# Patient Record
Sex: Male | Born: 1985 | Race: Black or African American | Hispanic: No | Marital: Single | Smoking: Never smoker
Health system: Southern US, Community
[De-identification: ages and names within clinical notes are randomized; demographics above are authoritative.]

## PROBLEM LIST (undated history)

## (undated) DIAGNOSIS — K219 Gastro-esophageal reflux disease without esophagitis: Secondary | ICD-10-CM

## (undated) DIAGNOSIS — I10 Essential (primary) hypertension: Secondary | ICD-10-CM

---

## 2020-02-06 NOTE — Congregational Nurse Program (Signed)
  Dept: [phone]   Congregational Nurse Program Note  Date of Encounter: 02/06/2020  Past Medical History: No past medical history on file.  Encounter Details: Client came in to establish care with Congregational Nurse. He is a new refugee from [place] and requesting assistance to schedule appointment with TB Nurse at [facility] department. Same has been scheduled for 02/12/20 and advised to call me for assistance with transportation.  Blood pressure is 138/89. I have educated him on heart healthy diet and to reduce salt intake. Information regarding physical exercise provided.  Arman Bogus RN BSN PCCN  [facility] [phone]

## 2020-02-07 ENCOUNTER — Other Ambulatory Visit: Payer: Self-pay

## 2020-02-07 ENCOUNTER — Emergency Department (HOSPITAL_COMMUNITY): Payer: Medicaid Other

## 2020-02-07 ENCOUNTER — Ambulatory Visit (HOSPITAL_COMMUNITY)
Admission: EM | Admit: 2020-02-07 | Discharge: 2020-02-07 | Disposition: A | Discharge status: Home / Self Care | Payer: Self-pay | Attending: Internal Medicine | Admitting: Internal Medicine

## 2020-02-07 ENCOUNTER — Encounter (HOSPITAL_COMMUNITY): Payer: Self-pay | Admitting: Emergency Medicine

## 2020-02-07 ENCOUNTER — Emergency Department (HOSPITAL_COMMUNITY)
Admission: EM | Admit: 2020-02-07 | Discharge: 2020-02-08 | Disposition: A | Discharge status: Home / Self Care | Payer: Medicaid Other | Attending: Emergency Medicine | Admitting: Emergency Medicine

## 2020-02-07 DIAGNOSIS — R079 Chest pain, unspecified: Secondary | ICD-10-CM | POA: Diagnosis not present

## 2020-02-07 DIAGNOSIS — I10 Essential (primary) hypertension: Secondary | ICD-10-CM | POA: Insufficient documentation

## 2020-02-07 DIAGNOSIS — K219 Gastro-esophageal reflux disease without esophagitis: Secondary | ICD-10-CM

## 2020-02-07 HISTORY — DX: Essential (primary) hypertension: I10

## 2020-02-07 HISTORY — DX: Gastro-esophageal reflux disease without esophagitis: K21.9

## 2020-02-07 LAB — BASIC METABOLIC PANEL
Anion gap: 10 (ref 5–15)
BUN: 12 mg/dL (ref 6–20)
CO2: 24 mmol/L (ref 22–32)
Calcium: 9.7 mg/dL (ref 8.9–10.3)
Chloride: 104 mmol/L (ref 98–111)
Creatinine, Ser: 0.89 mg/dL (ref 0.61–1.24)
GFR, Estimated: >60 mL/min (ref >60)
Glucose, Bld: 99 mg/dL (ref 70–99)
Potassium: 3.6 mmol/L (ref 3.5–5.1)
Sodium: 138 mmol/L (ref 135–145)

## 2020-02-07 LAB — TROPONIN I (HIGH SENSITIVITY)
Troponin I (High Sensitivity): <2 ng/L (ref <18)
Troponin I (High Sensitivity): 2 ng/L (ref <18)

## 2020-02-07 LAB — CBC
HCT: 46.4 % (ref 39.0–52.0)
Hemoglobin: 15.8 g/dL (ref 13.0–17.0)
MCH: 30.5 pg (ref 26.0–34.0)
MCHC: 34.1 g/dL (ref 30.0–36.0)
MCV: 89.6 fL (ref 80.0–100.0)
Platelets: 143 10*3/uL — ABNORMAL LOW (ref 150–400)
RBC: 5.18 MIL/uL (ref 4.22–5.81)
RDW: 12.9 % (ref 11.5–15.5)
WBC: 5.3 10*3/uL (ref 4.0–10.5)
nRBC: 0 % (ref 0.0–0.2)

## 2020-02-07 MED ORDER — FAMOTIDINE 20 MG PO TABS
20.0000 mg | ORAL_TABLET | Freq: Two times a day (BID) | ORAL | 0 refills | Status: AC
Start: 2020-02-07 — End: ?

## 2020-02-07 NOTE — ED Notes (Signed)
Patient is being discharged from the Urgent Care Center and sent to the Emergency Department via wheelchair by staff. Per Roosvelt Maser, PA, patient is stable but in need of higher level of care due to chest pain, abnormal EKG. Patient is aware and verbalizes understanding of plan of care.  Vitals:   02/07/20 1221  BP: (!) 157/95  Pulse: 63  Resp: 18  Temp: 97.9 F (36.6 C)  SpO2: 100%

## 2020-02-07 NOTE — ED Notes (Signed)
Looked for patient multiple times to complete EKG. Pt not visualized in bed or seen in hallway.

## 2020-02-07 NOTE — ED Provider Notes (Signed)
[facility] Provider Note   CSN: [id number] Arrival date & time: 02/07/20  1315     History Chief Complaint  Patient presents with  . Chest Pain    [facility] is a 34 y.o. male with a history of GERD, H. pylori, and HTN who presents to the emergency department with a chief complaint of chest pain.  The patient states that he had an episode of mid to left-sided chest pain last night that began after eating spicy lentil soup.  He characterizes the episode as feeling a warm, burning station in his chest.  The pain radiated up into his throat, to his shoulders.  It did not radiate to his back.  Pain was better when he was up and exerting himself and drinking a glass of water.  Pain was worse when he tried to lay flat and go to sleep.  He has a history of similar pain and was diagnosed with H. pylori in [place] in 2016.  He was successfully treated and was given pantoprazole to take for GERD.  He attempted to take pantoprazole last night with no improvement in his symptoms.  Chest pain had resolved when he awoke this morning and he has not had any recurrence of symptoms today.  He reports associated increased burping and belching and a nonproductive cough that resolved along with the episode of chest pain.  He denies shortness of breath, diaphoresis, fever, palpitations, abdominal pain, nausea, vomiting, diarrhea, back pain, or URI symptoms.  He has a history of latent TB.  He is following up on November 10 with the TB program.  He denies any hemoptysis, night sweats, or weight loss.  He is a never smoker.  He denies illicit or recreational substance use.  No alcohol use.  Patient speaks both [place] and [place].  [place] interpreter was offered, but patient requested to speak English during his ER interactions.  He relocated to the [place] from [place] on September 24.  The history is provided by the patient and medical records. No language interpreter  was used.       Past Medical History:  Diagnosis Date  . GERD (gastroesophageal reflux disease)   . Hypertension     There are no problems to display for this patient.   History reviewed. No pertinent surgical history.     Family History  Family history unknown: Yes    Social History   Tobacco Use  . Smoking status: Never Smoker  . Smokeless tobacco: Never Used  Vaping Use  . Vaping Use: Never used  Substance Use Topics  . Alcohol use: Not Currently  . Drug use: Not on file    Home Medications Prior to Admission medications   Not on File    Allergies    Eggs or egg-derived products  Review of Systems   Review of Systems  Constitutional: Negative for appetite change, chills, diaphoresis and fever.  HENT: Negative for congestion and sore throat.   Eyes: Negative for visual disturbance.  Respiratory: Positive for cough. Negative for shortness of breath.   Cardiovascular: Positive for chest pain. Negative for palpitations and leg swelling.  Gastrointestinal: Negative for abdominal pain, blood in stool, diarrhea, nausea and vomiting.       Belching  Genitourinary: Negative for dysuria.  Musculoskeletal: Negative for back pain, myalgias, neck pain and neck stiffness.  Skin: Negative for rash.  Allergic/Immunologic: Negative for immunocompromised state.  Neurological: Negative for dizziness, seizures, syncope, speech difficulty, weakness, light-headedness,  numbness and headaches.  Psychiatric/Behavioral: Negative for confusion.    Physical Exam Updated Vital Signs BP (!) 129/93 (BP Location: Left Arm)   Pulse 64   Temp 98.5 F (36.9 C) (Oral)   Resp 16   SpO2 96%   Physical Exam Vitals and nursing note reviewed.  Constitutional:      General: He is not in acute distress.    Appearance: He is well-developed. He is not ill-appearing, toxic-appearing or diaphoretic.     Comments: Well-appearing.  No acute distress.  HENT:     Head: Normocephalic.      Mouth/Throat:     Mouth: Mucous membranes are moist.     Pharynx: No oropharyngeal exudate or posterior oropharyngeal erythema.  Eyes:     Conjunctiva/sclera: Conjunctivae normal.  Cardiovascular:     Rate and Rhythm: Normal rate and regular rhythm.     Pulses: Normal pulses.     Heart sounds: Normal heart sounds. No murmur heard.  No friction rub. No gallop.   Pulmonary:     Effort: Pulmonary effort is normal. No respiratory distress.     Breath sounds: No stridor. No wheezing, rhonchi or rales.     Comments: Lungs are clear to auscultation bilaterally without increased work of breathing.  No reproducible tenderness palpation to the chest wall. Chest:     Chest wall: No tenderness.  Abdominal:     General: There is no distension.     Palpations: Abdomen is soft. There is no mass.     Tenderness: There is no abdominal tenderness. There is no right CVA tenderness, left CVA tenderness, guarding or rebound.     Hernia: No hernia is present.     Comments: Abdomen soft, nontender, nondistended.  Normoactive bowel sounds.  No peritoneal signs.  Musculoskeletal:        General: No tenderness.     Cervical back: Neck supple.     Right lower leg: No edema.     Left lower leg: No edema.  Skin:    General: Skin is warm and dry.     Capillary Refill: Capillary refill takes less than 2 seconds.     Coloration: Skin is not jaundiced or pale.     Findings: No erythema, lesion or rash.  Neurological:     Mental Status: He is alert.  Psychiatric:        Behavior: Behavior normal.     ED Results / Procedures / Treatments   Labs (all labs ordered are listed, but only abnormal results are displayed) Labs Reviewed  CBC - Abnormal; Notable for the following components:      Result Value   Platelets 143 (*)    All other components within normal limits  BASIC METABOLIC PANEL  TROPONIN I (HIGH SENSITIVITY)  TROPONIN I (HIGH SENSITIVITY)    EKG None  Radiology DG Chest 2 View  Result  Date: 02/07/2020 CLINICAL DATA:  Left-sided chest pain. EXAM: CHEST - 2 VIEW COMPARISON:  No prior. FINDINGS: Mediastinum hilar structures normal. Lungs are clear. No pleural effusion or pneumothorax. Heart size normal. No acute bony abnormality identified. IMPRESSION: No acute cardiopulmonary disease. Electronically Signed   By: Maisie Fus  Register   On: 02/07/2020 14:25    Procedures Procedures (including critical care time)  Medications Ordered in ED Medications - No data to display  ED Course  I have reviewed the triage vital signs and the nursing notes.  Pertinent labs & imaging results that were available during my care of  the patient were reviewed by me and considered in my medical decision making (see chart for details).    MDM Rules/Calculators/A&P                          34 year old male with a history of GERD, H. pylori, and HTN presenting with chest pain accompanied by belching, and nonproductive cough began last night after eating a spicy lentil soup and resolved when he awoke this morning.  He has had no other associated symptoms.   This patient presents to the ED for concern of  chest pain this involves an extensive number of treatment options, and is a complaint that carries with it a high risk of complications and morbidity.  The differential diagnosis includes GERD, H. pylori, gastritis, tuberculosis, myocarditis, pericarditis, COVID-19, ACS, tension pneumothorax, community-acquired pneumonia, aortic dissection, PUD, or PE.      Lab Tests:    I Ordered, reviewed, and interpreted labs, which included  CBC, metabolic panel, troponin that showed were all reassuring.    Imaging Studies ordered:    I ordered imaging studies which included chest x-ray. I independently visualized and interpreted imaging which did not show any acute findings nor did it demonstrate any nodules or cavitary lesions or hilar lymphadenopathy.   Additional history obtained:    Previous records  obtained and reviewed   Patient has been pain-free for more than 24 hours.  I suspect that he is having a recurrence of GERD.  I have a low suspicion for peptic ulcer disease, PE, aortic dissection, ACS.  His chest x-ray was reassuring without cavitary lesions or nodules, less likely active TB or latent TB. HEAR score is 1, low suspicion for ACS.  Although he did relocate to the [place] from [place] over a month and a half ago, he is PERC negative, and I am less suspicious for PE.  Symptoms are most consistent with GERD.  Will discharge the patient home with a course of Pepcid.  He was instructed how to take his home PPI.  He has a follow-up appointment in 5 days and can be really evaluated in the clinic at that time.  Patient's EKG from the ER has not been performed.  I did review his EKG from urgent care that demonstrated normal sinus rhythm.  We will plan for discharge to home after EKG has been ordered and reviewed in the ER.  00:03-notified by ER tech that patient eloped prior to EKG being performed and did not receive his discharge papers or instructions.  However, I did verbalize plan of care to the patient earlier when I was at the bedside, but was unable to give him ER return precautions prior to elopement.  Final Clinical Impression(s) / ED Diagnoses Final diagnoses:  None    Rx / DC Orders ED Discharge Orders    None       Alieyah Spader A, PA-C 02/08/20 0009    Pricilla Loveless, MD 02/08/20 1521

## 2020-02-07 NOTE — ED Notes (Signed)
Pt not in department at this time. Provider aware.

## 2020-02-07 NOTE — ED Triage Notes (Signed)
USing [place] interpreter: Pt c/o chest pain last night on the left side. He states he was feeling chills and then a warm sensation. He states he has a hx of high blood pressure. He has a letter showing he was positive for latent TB. He states he took some medication for acid reflux.

## 2020-02-07 NOTE — ED Notes (Addendum)
Patient is being discharged from the Urgent Care and sent to the Emergency Department via  Wheelchair.  Per Dahlia Byes NP, patient is in need of higher level of care due to chest pain. Patient is aware and verbalizes understanding of plan of care.  Vitals:   02/07/20 1221  BP: (!) 157/95  Pulse: 63  Resp: 18  Temp: 97.9 F (36.6 C)  SpO2: 100%

## 2020-02-07 NOTE — ED Triage Notes (Signed)
Pt arrives from UC for c/o L sided chest pain that began last night, states that he gets cold and then feels a warm sensation in his chest. Pt in TB control program, hx of latent TB, has appt on 11/10 for f/u with TB program.

## 2020-02-07 NOTE — Discharge Instructions (Addendum)
Waad ku mahadsan tahay inaad ii ogolaatay inaan maanta kugu daryeelo [place].  Shaqadaada maanta waxay ahayd mid niyad-jab leh. Hubi inaad ilaaliso ballanta dabagalka 5 maalmood gudahood.  Qaado 1 kiniin oo famotidine ah 2 jeer maalin kasta sida loogu baahan yahay reflux aashitada, [place] gubanaya badhtamaha laabtaada oo ay ku sii badanayso daac ama finan, gaar ahaan ka dib markaad cuntid cuntooyinka basbaaska leh.  Waxa kale oo aad qaadan kartaa pantoprazole ama omeprazole, oo aad hadda guriga ku haysato, laakiin dawadani waxay qaadan kartaa dhawr maalmood si ay u shaqeyso.  Soo celi qaybta gargaarka degdegga ah haddii aad qabto xanuun laabta ah oo leh neefta oo ku dhegta, dhididka, haddii aad [address], matag [address] [place], [place] wax kale oo cusub, oo ku saabsan calaamadaha.  Thank you for allowing me to care for you today in the Emergency Department.   Your work-up today was reassuring.  Make sure that you keep your follow-up appointment in 5 days.  Take 1 tablet of famotidine 2 times daily as needed for acid reflux, burning in the center of your chest with increased burping or belching, especially after eating spicy foods.  You can also take pantoprazole or omeprazole, which you currently have at home, but this medication can take several days to work.  Return the emergency department if you have chest pain with shortness of breath, sweating, if you pass out, have uncontrollable vomiting, or other new, concerning symptoms.

## 2021-10-29 IMAGING — CR DG CHEST 2V
2 series · 2 of 2 positions shown · non-contrast
Comparison: No prior.

CLINICAL DATA: Left-sided chest pain.

EXAM:
CHEST - 2 VIEW

[chest pa]
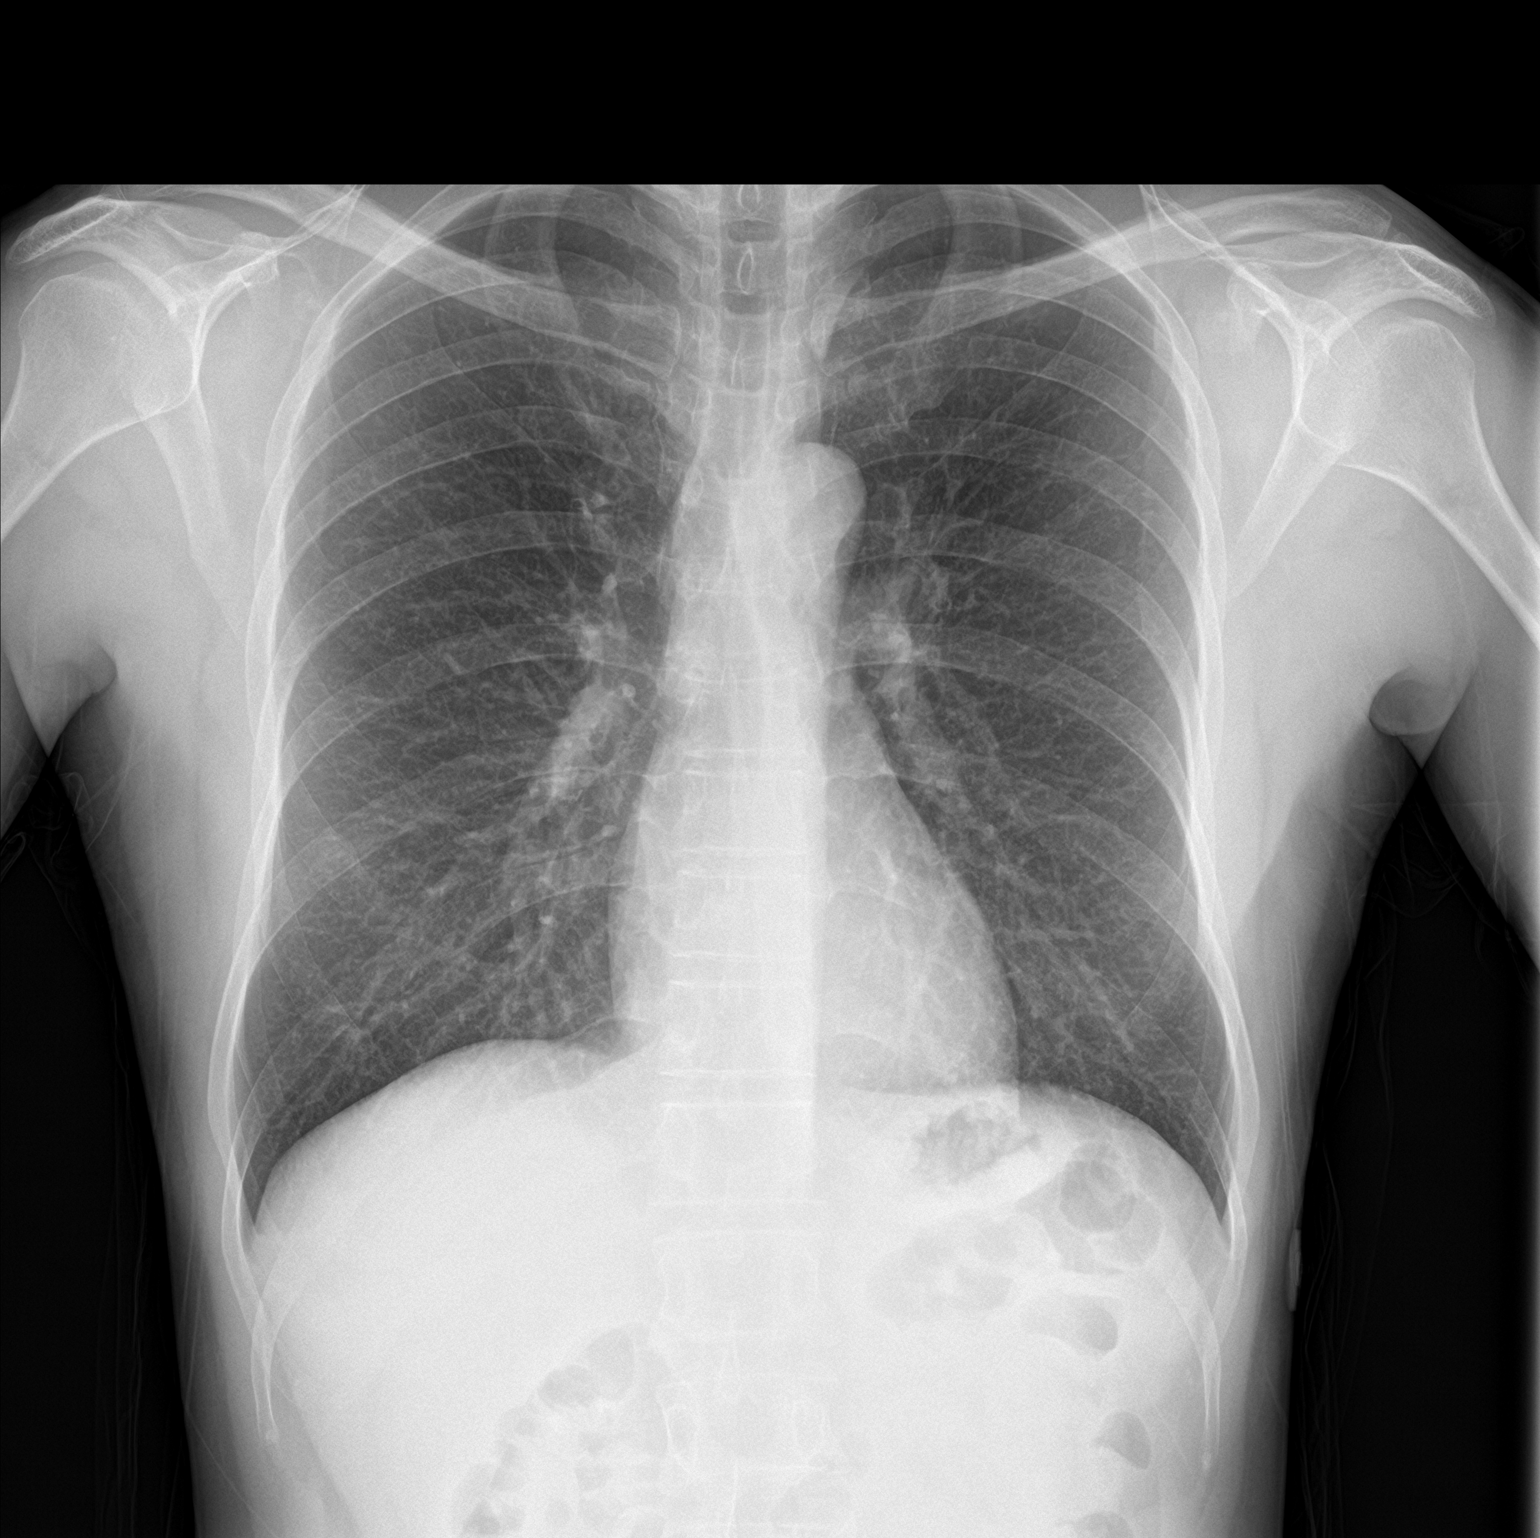

[chest lat]
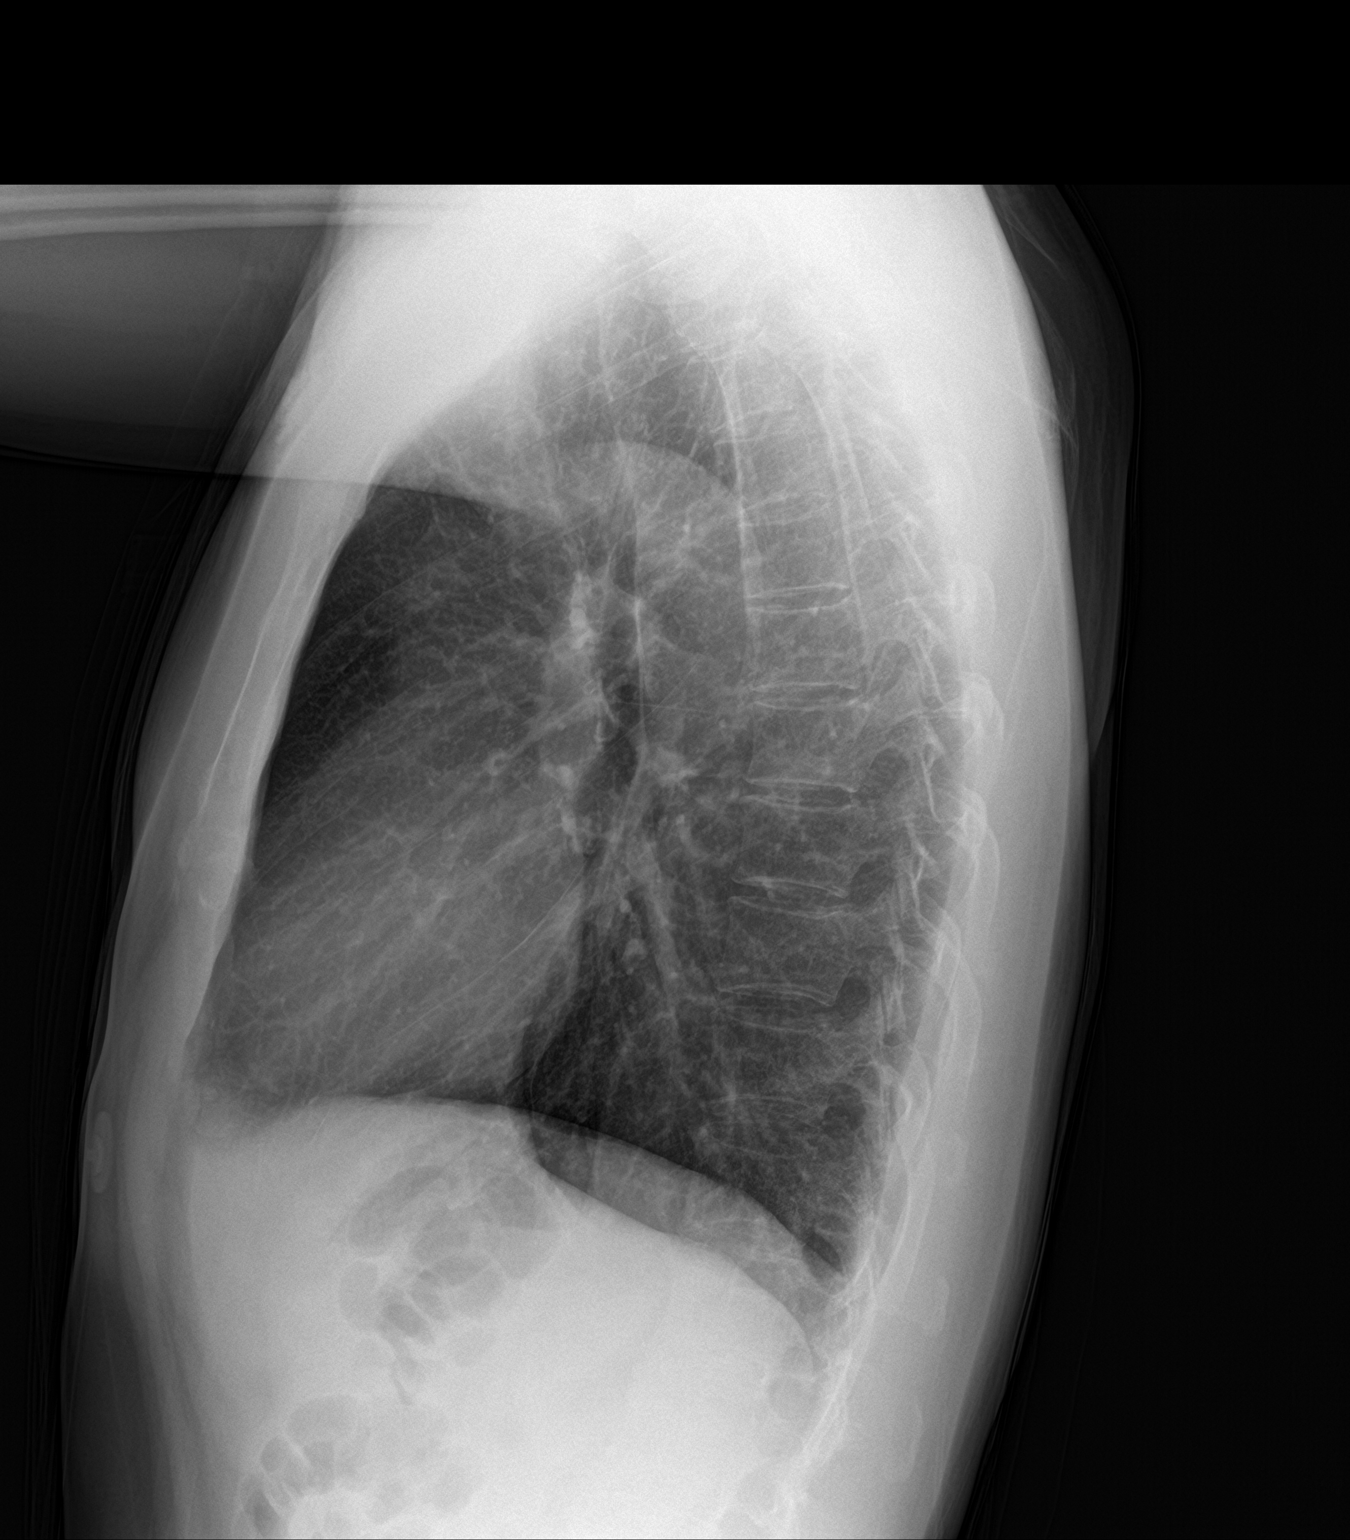

[2 of 2 positions shown; findings below may reference images not displayed]

FINDINGS: Mediastinum hilar structures normal. Lungs are clear. No pleural
effusion or pneumothorax. Heart size normal. No acute bony
abnormality identified.
IMPRESSION: No acute cardiopulmonary disease.
